# Patient Record
Sex: Male | Born: 1970 | Race: Asian | Hispanic: Yes | Marital: Single
Health system: Southern US, Community
[De-identification: ages and names within clinical notes are randomized; demographics above are authoritative.]

---

## 2019-05-17 ENCOUNTER — Emergency Department (HOSPITAL_COMMUNITY)
Admission: EM | Admit: 2019-05-17 | Discharge: 2019-05-17 | Disposition: A | Discharge status: Home / Self Care | Payer: No Typology Code available for payment source | Attending: Emergency Medicine | Admitting: Emergency Medicine

## 2019-05-17 ENCOUNTER — Emergency Department (HOSPITAL_COMMUNITY): Payer: No Typology Code available for payment source

## 2019-05-17 DIAGNOSIS — Y9389 Activity, other specified: Secondary | ICD-10-CM | POA: Diagnosis not present

## 2019-05-17 DIAGNOSIS — Y998 Other external cause status: Secondary | ICD-10-CM | POA: Insufficient documentation

## 2019-05-17 DIAGNOSIS — S098XXA Other specified injuries of head, initial encounter: Secondary | ICD-10-CM | POA: Insufficient documentation

## 2019-05-17 DIAGNOSIS — S1980XA Other specified injuries of unspecified part of neck, initial encounter: Secondary | ICD-10-CM | POA: Diagnosis present

## 2019-05-17 DIAGNOSIS — S29001A Unspecified injury of muscle and tendon of front wall of thorax, initial encounter: Secondary | ICD-10-CM | POA: Insufficient documentation

## 2019-05-17 DIAGNOSIS — S0081XA Abrasion of other part of head, initial encounter: Secondary | ICD-10-CM | POA: Insufficient documentation

## 2019-05-17 DIAGNOSIS — Y92410 Unspecified street and highway as the place of occurrence of the external cause: Secondary | ICD-10-CM | POA: Diagnosis not present

## 2019-05-17 DIAGNOSIS — K760 Fatty (change of) liver, not elsewhere classified: Secondary | ICD-10-CM | POA: Diagnosis not present

## 2019-05-17 LAB — CBC
HCT: 43.3 % (ref 39.0–52.0)
Hemoglobin: 15.2 g/dL (ref 13.0–17.0)
MCH: 33.6 pg (ref 26.0–34.0)
MCHC: 35.1 g/dL (ref 30.0–36.0)
MCV: 95.8 fL (ref 80.0–100.0)
Platelets: 275 10*3/uL (ref 150–400)
RBC: 4.52 MIL/uL (ref 4.22–5.81)
RDW: 11.9 % (ref 11.5–15.5)
WBC: 7.7 10*3/uL (ref 4.0–10.5)
nRBC: 0 % (ref 0.0–0.2)

## 2019-05-17 LAB — COMPREHENSIVE METABOLIC PANEL
ALT: 30 U/L (ref 0–44)
AST: 31 U/L (ref 15–41)
Albumin: 4 g/dL (ref 3.5–5.0)
Alkaline Phosphatase: 79 U/L (ref 38–126)
Anion gap: 11 (ref 5–15)
BUN: 21 mg/dL — ABNORMAL HIGH (ref 6–20)
CO2: 22 mmol/L (ref 22–32)
Calcium: 9.2 mg/dL (ref 8.9–10.3)
Chloride: 104 mmol/L (ref 98–111)
Creatinine, Ser: 0.99 mg/dL (ref 0.61–1.24)
GFR calc Af Amer: >60 mL/min (ref >60)
GFR calc non Af Amer: >60 mL/min (ref >60)
Glucose, Bld: 125 mg/dL — ABNORMAL HIGH (ref 70–99)
Potassium: 3.8 mmol/L (ref 3.5–5.1)
Sodium: 137 mmol/L (ref 135–145)
Total Bilirubin: 0.8 mg/dL (ref 0.3–1.2)
Total Protein: 7.3 g/dL (ref 6.5–8.1)

## 2019-05-17 LAB — PROTIME-INR
INR: 1 (ref 0.8–1.2)
Prothrombin Time: 12.9 seconds (ref 11.4–15.2)

## 2019-05-17 LAB — I-STAT BETA HCG BLOOD, ED (MC, WL, AP ONLY): I-stat hCG, quantitative: <5 m[IU]/mL (ref <5)

## 2019-05-17 LAB — LACTIC ACID, PLASMA: Lactic Acid, Venous: 1.7 mmol/L (ref 0.5–1.9)

## 2019-05-17 MED ORDER — IOHEXOL 300 MG/ML  SOLN
100.0000 mL | Freq: Once | INTRAMUSCULAR | Status: AC | PRN
  Administered 2019-05-17: 100 mL via INTRAVENOUS

## 2019-05-17 MED ORDER — METHOCARBAMOL 500 MG PO TABS: 500.0000 mg | ORAL_TABLET | Freq: Three times a day (TID) | ORAL | 0 refills | Status: AC | PRN

## 2019-05-17 MED ORDER — NAPROXEN 375 MG PO TABS: 375.0000 mg | ORAL_TABLET | Freq: Two times a day (BID) | ORAL | 0 refills | Status: AC
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
## 2019-05-17 NOTE — ED Notes (Signed)
Discharge instructions reviewed with patient, prescriptions given.  No further questions at this time
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
## 2019-05-17 NOTE — Discharge Instructions (Signed)

## 2019-05-17 NOTE — ED Notes (Signed)
Patient took off c-collar by himself.  I asked to put back on and he said No that he is fine and was moving his neck all around. I explained we were being cautious and he told me not to worry

## 2020-03-07 IMAGING — CT CT CHEST W/ CM
2 of 4 series · 13 of 46 positions shown, 15 images · IV contrast (omnipaque)
Comparison: None.

CLINICAL DATA: Acute pain due to trauma

EXAM:
CT CHEST, ABDOMEN, AND PELVIS WITH CONTRAST
TECHNIQUE: Multidetector CT imaging of the chest, abdomen and pelvis was
performed following the standard protocol during bolus
administration of intravenous contrast.
CONTRAST:  100mL OMNIPAQUE IOHEXOL 300 MG/ML  SOLN

[Series 3: cap with · axial · 0.64mm/px · z∈[-874,-309]mm · 10 of 131 slices shown, 12 images]
[im 9/131  soft-tissue]
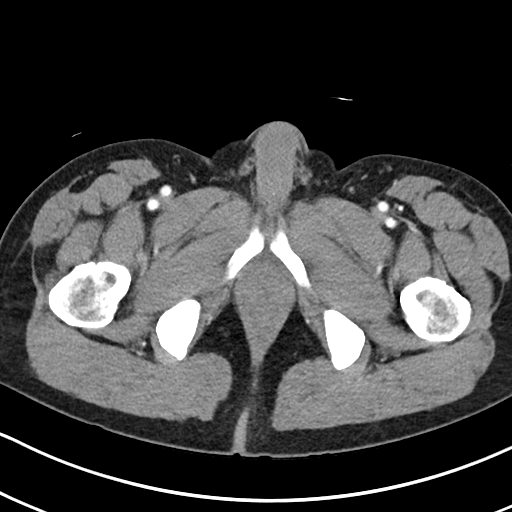
[im 9/131  bone]
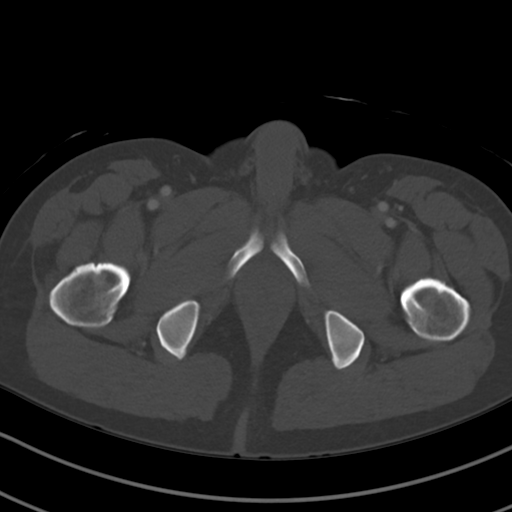
[im 27/131  soft-tissue]
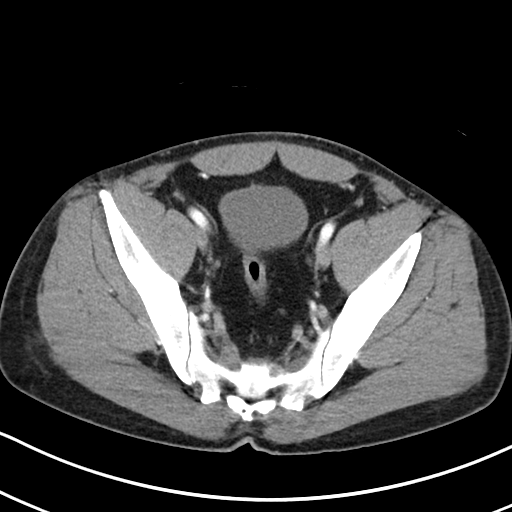
[im 35/131  soft-tissue]
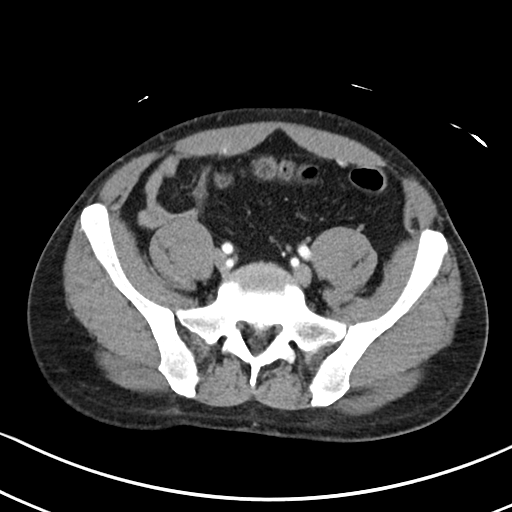
[im 44/131  soft-tissue]
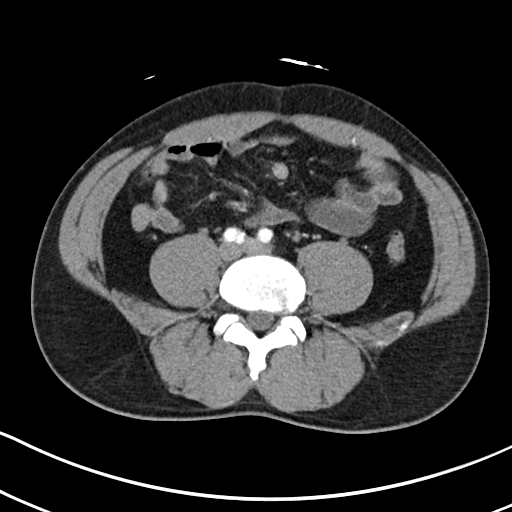
[im 61/131  soft-tissue]
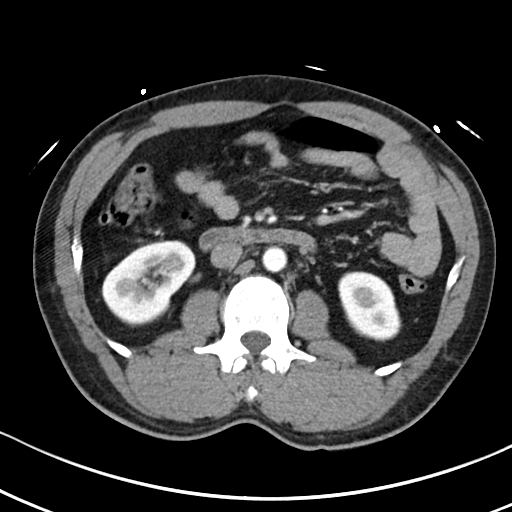
[im 70/131  soft-tissue]
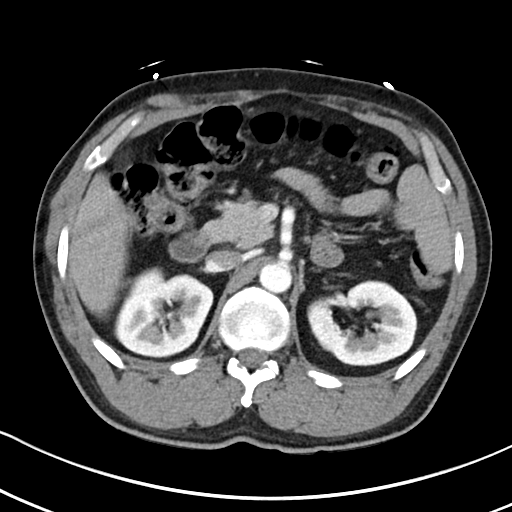
[im 87/131  soft-tissue]
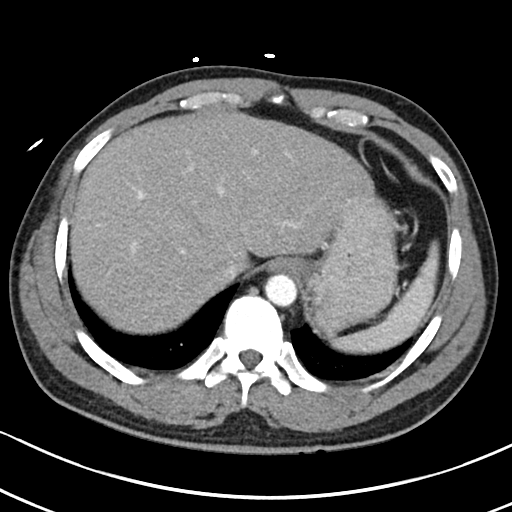
[im 96/131  soft-tissue]
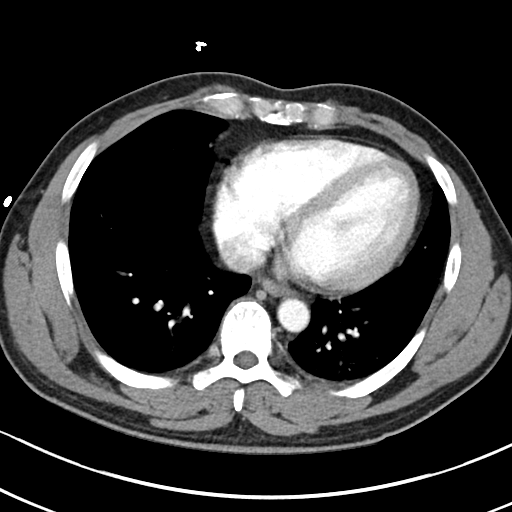
[im 105/131  soft-tissue]
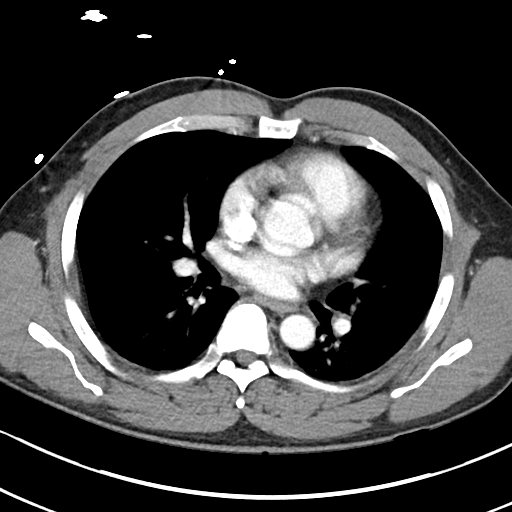
[im 105/131  bone]
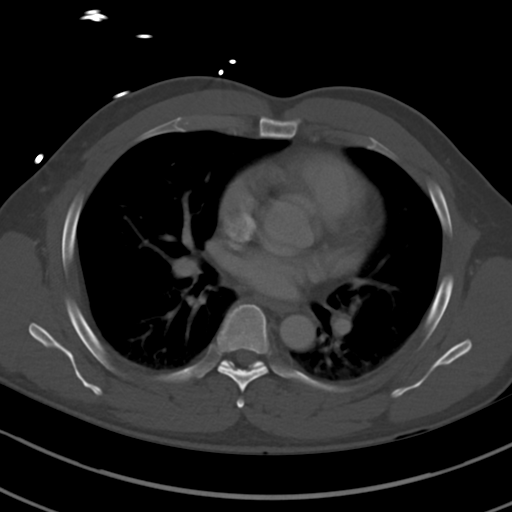
[im 122/131  soft-tissue]
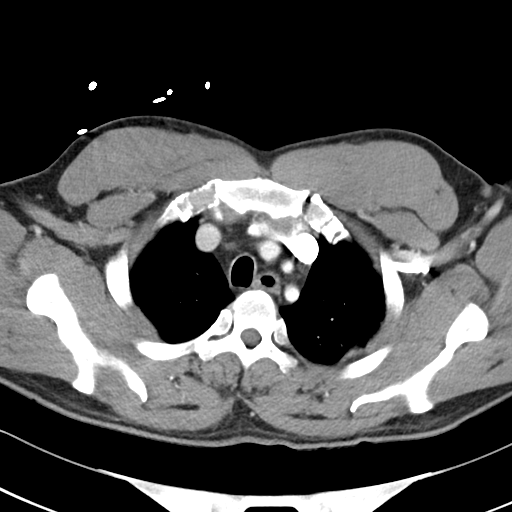

[Series 6: cor · coronal · 0.64mm/px · 3 of 86 slices shown]
[im 29/86  soft-tissue]
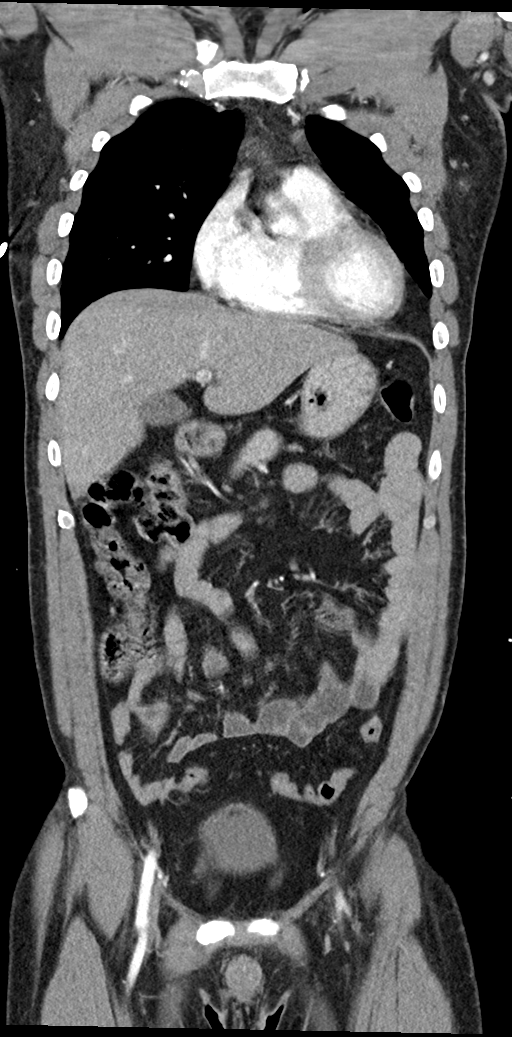
[im 38/86  soft-tissue]
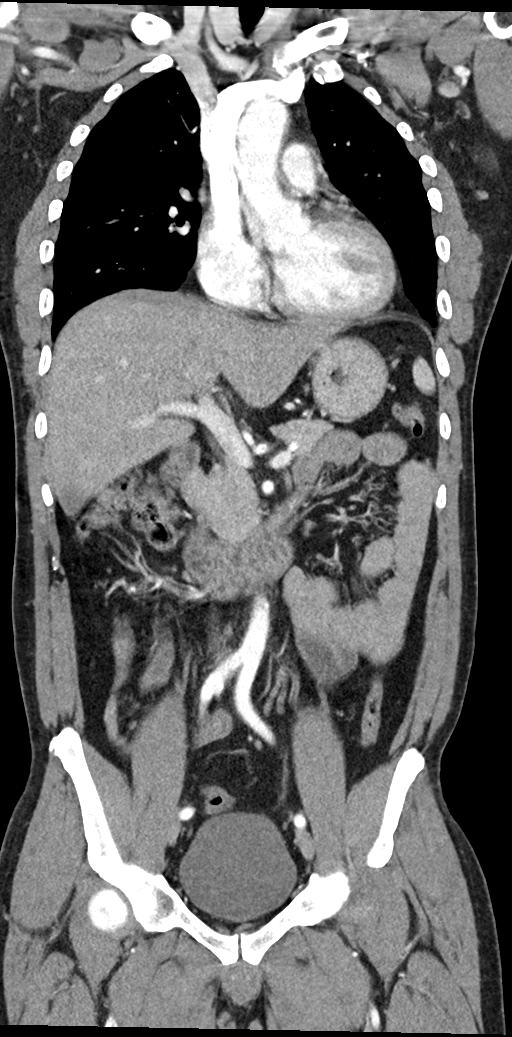
[im 48/86  soft-tissue]
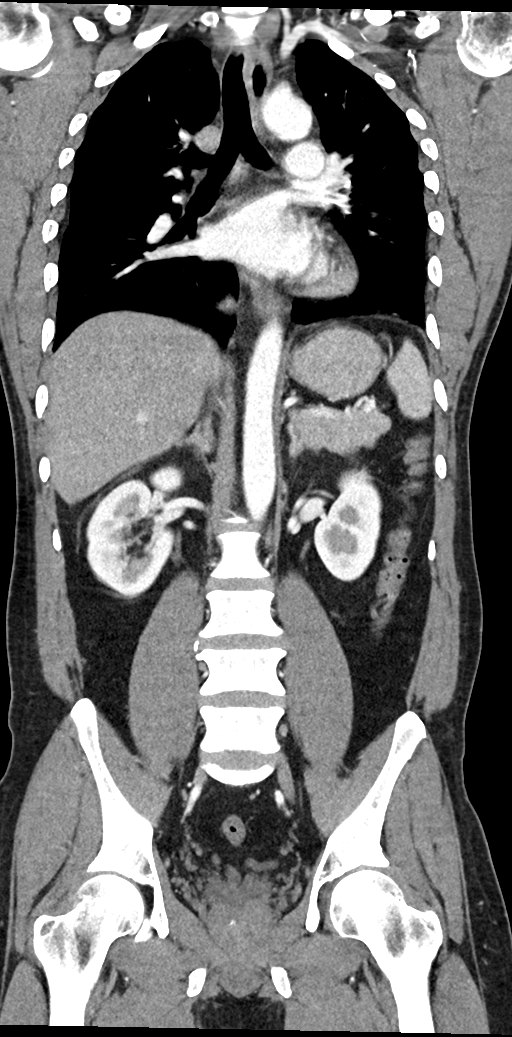

[13 of 46 positions shown; findings below may reference images not displayed]

FINDINGS: CT CHEST FINDINGS

Cardiovascular: The heart size is normal. There is no evidence for
an aortic dissection. There is no large centrally located pulmonary
embolism. There is no significant pericardial effusion.

Mediastinum/Nodes:

--No mediastinal or hilar lymphadenopathy.

--No axillary lymphadenopathy.

--No supraclavicular lymphadenopathy.

--Normal thyroid gland.

--The esophagus is unremarkable

Lungs/Pleura: No pulmonary nodules or masses. No pleural effusion or
pneumothorax. No focal airspace consolidation. No focal pleural
abnormality.

Musculoskeletal: No chest wall abnormality. No acute or significant
osseous findings.

CT ABDOMEN PELVIS FINDINGS

Hepatobiliary: There is decreased hepatic attenuation suggestive of
hepatic steatosis. Normal gallbladder.There is no biliary ductal
dilation.

Pancreas: Normal contours without ductal dilatation. No
peripancreatic fluid collection.

Spleen: No splenic laceration or hematoma.

Adrenals/Urinary Tract:

--Adrenal glands: No adrenal hemorrhage.

--Right kidney/ureter: There is a partially duplicated right-sided
collecting system. There are no radiopaque stones. No
hydronephrosis.

--Left kidney/ureter: No hydronephrosis or perinephric hematoma.

--Urinary bladder: Unremarkable.

Stomach/Bowel:

--Stomach/Duodenum: No hiatal hernia or other gastric abnormality.
Normal duodenal course and caliber.

--Small bowel: No dilatation or inflammation.

--Colon: No focal abnormality.

--Appendix: Normal.

Vascular/Lymphatic: Normal course and caliber of the major abdominal
vessels.

--No retroperitoneal lymphadenopathy.

--No mesenteric lymphadenopathy.

--No pelvic or inguinal lymphadenopathy.

Reproductive: Unremarkable

Other: No ascites or free air. The abdominal wall is normal.

Musculoskeletal. No acute displaced fractures.
IMPRESSION: 1. No acute thoracic, abdominal or pelvic injury.
2. Hepatic steatosis.
